# Patient Record
Sex: Female | Born: 2005 | Race: White | Hispanic: No | Marital: Single | Smoking: Former smoker
Health system: Southern US, Community
[De-identification: ages and names within clinical notes are randomized; demographics above are authoritative.]

## PROBLEM LIST (undated history)

## (undated) DIAGNOSIS — R51 Headache: Secondary | ICD-10-CM

## (undated) DIAGNOSIS — B974 Respiratory syncytial virus as the cause of diseases classified elsewhere: Secondary | ICD-10-CM

## (undated) DIAGNOSIS — B338 Other specified viral diseases: Secondary | ICD-10-CM

## (undated) DIAGNOSIS — R519 Headache, unspecified: Secondary | ICD-10-CM

## (undated) HISTORY — PX: MULTIPLE TOOTH EXTRACTIONS: SHX2053

---
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
## 2023-01-03 ENCOUNTER — Ambulatory Visit
Admission: EM | Admit: 2023-01-03 | Discharge: 2023-01-03 | Disposition: A | Discharge status: Home / Self Care | Payer: Medicaid Other

## 2023-01-03 DIAGNOSIS — L02415 Cutaneous abscess of right lower limb: Secondary | ICD-10-CM | POA: Diagnosis not present

## 2023-01-03 MED ORDER — DOXYCYCLINE HYCLATE 100 MG PO CAPS: 100.0000 mg | ORAL_CAPSULE | Freq: Two times a day (BID) | ORAL | 0 refills | Status: AC

## 2023-01-03 NOTE — Discharge Instructions (Signed)
I have prescribed an antibiotic for abscess of your skin.  Use warm compresses as we discussed.  Follow-up if any symptoms persist or worsen.

## 2023-01-03 NOTE — ED Provider Notes (Signed)
[name]    CSN: [id number] Arrival date & time: 01/03/23  1226      History   Chief Complaint Chief Complaint  Patient presents with   Abscess    Abscess inner right thigh x2 days    HPI Brittany Ingram is a 17 y.o. female.   Patient presents with concerns of abscess to right inner thigh that she noticed about 2 to 3 days ago.  Patient denies any purulent drainage from the area or any associated fever.  Denies history of abscesses.   Abscess   Past Medical History:  Diagnosis Date   Headache    RSV infection    age 30 months    Patient Active Problem List   Diagnosis Date Noted   Severe recurrent major depression without psychotic features (HCC) 03/19/2019   MDD (major depressive disorder), recurrent severe, without psychosis (HCC) 05/07/2018    Past Surgical History:  Procedure Laterality Date   CESAREAN SECTION     MULTIPLE TOOTH EXTRACTIONS      OB History   No obstetric history on file.      Home Medications    Prior to Admission medications   Medication Sig Start Date End Date Taking? Authorizing Provider  doxycycline (VIBRAMYCIN) 100 MG capsule Take 1 capsule (100 mg total) by mouth 2 (two) times daily. 01/03/23  Yes , Rolly Salter E, FNP  ibuprofen (ADVIL) 800 MG tablet Take 800 mg by mouth every 8 (eight) hours.   Yes [provider]  Acetaminophen-Caff-Pyrilamine (MIDOL COMPLETE PO) Take 1-2 tablets by mouth every 6 (six) hours as needed (for cramping, pain, or discomfort).     [provider]  Brexpiprazole 1 MG TABS Take 2 tablets (2 mg total) by mouth daily. 03/24/19   Leata Mouse, MD  escitalopram (LEXAPRO) 20 MG tablet Take 1 tablet (20 mg total) by mouth daily. 03/24/19   Leata Mouse, MD  hydrOXYzine (ATARAX/VISTARIL) 50 MG tablet Take 1 tablet (50 mg total) by mouth at bedtime as needed for anxiety (or sleep). 03/23/19   Leata Mouse, MD    Family History Family History   Problem Relation Age of Onset   Mental illness Mother    Alcohol abuse Father    Drug abuse Father     Social History Social History   Tobacco Use   Smoking status: Former    Packs/day: .5    Types: Cigarettes   Smokeless tobacco: Never  Vaping Use   Vaping Use: Every day  Substance Use Topics   Alcohol use: No   Drug use: Yes    Frequency: 3.0 times per week    Types: Marijuana    Comment: Episodic use     Allergies   Depakote [valproic acid]   Review of Systems Review of Systems Per HPI  Physical Exam Triage Vital Signs ED Triage Vitals  Enc Vitals Group     BP 01/03/23 1255 112/79     Pulse Rate 01/03/23 1255 (!) 112     Resp 01/03/23 1255 19     Temp 01/03/23 1255 98.2 F (36.8 C)     Temp src --      SpO2 01/03/23 1255 95 %     Weight 01/03/23 1252 260 lb (117.9 kg)     Height 01/03/23 1252 5\' 6"  (1.676 m)     Head Circumference --      Peak Flow --      Pain Score 01/03/23 1252 5     Pain  Loc --      Pain Edu? --      Excl. in GC? --    No data found.  Updated Vital Signs BP 112/79 (BP Location: Right Arm)   Pulse (!) 112   Temp 98.2 F (36.8 C)   Resp 19   Ht 5\' 6"  (1.676 m)   Wt 260 lb (117.9 kg)   SpO2 95%   BMI 41.97 kg/m   Visual Acuity Right Eye Distance:   Left Eye Distance:   Bilateral Distance:    Right Eye Near:   Left Eye Near:    Bilateral Near:     Physical Exam Constitutional:      General: She is not in acute distress.    Appearance: Normal appearance. She is not toxic-appearing or diaphoretic.  HENT:     Head: Normocephalic and atraumatic.  Eyes:     Extraocular Movements: Extraocular movements intact.     Conjunctiva/sclera: Conjunctivae normal.  Pulmonary:     Effort: Pulmonary effort is normal.  Skin:    Comments: Patient has approximately 0.5 inch very indurated abscess present to right inner thigh.  Patient has surrounding erythema with no induration or fluctuance that extends about 1 inch on each  side.  Minimal warmth noted.  No drainage noted.  Neurological:     General: No focal deficit present.     Mental Status: She is alert and oriented to person, place, and time. Mental status is at baseline.  Psychiatric:        Mood and Affect: Mood normal.        Behavior: Behavior normal.        Thought Content: Thought content normal.        Judgment: Judgment normal.      UC Treatments / Results  Labs (all labs ordered are listed, but only abnormal results are displayed) Labs Reviewed - No data to display  EKG   Radiology No results found.  Procedures Procedures (including critical care time)  Medications Ordered in UC Medications - No data to display  Initial Impression / Assessment and Plan / UC Course  I have reviewed the triage vital signs and the nursing notes.  Pertinent labs & imaging results that were available during my care of the patient were reviewed by me and considered in my medical decision making (see chart for details).     Will treat abscess with doxycycline.  Abscess is not conducive to drainage at this time.  Advised strict follow-up if any symptoms persist or worsen.  Patient verbalized understanding and was agreeable with plan. Final Clinical Impressions(s) / UC Diagnoses   Final diagnoses:  Abscess of right thigh     Discharge Instructions      I have prescribed an antibiotic for abscess of your skin.  Use warm compresses as we discussed.  Follow-up if any symptoms persist or worsen.    ED Prescriptions     Medication Sig Dispense Auth. Provider   doxycycline (VIBRAMYCIN) 100 MG capsule Take 1 capsule (100 mg total) by mouth 2 (two) times daily. 20 capsule Gustavus Bryant, [place]      PDMP not reviewed this encounter.   Gustavus Bryant, [place] 01/03/23 1323

## 2023-01-03 NOTE — ED Triage Notes (Signed)
Pt states that she has an abscess on her inner right thigh. X2 days

## 2023-01-21 ENCOUNTER — Other Ambulatory Visit: Payer: Self-pay

## 2023-01-21 ENCOUNTER — Encounter (HOSPITAL_BASED_OUTPATIENT_CLINIC_OR_DEPARTMENT_OTHER): Payer: Self-pay | Admitting: Emergency Medicine

## 2023-01-21 DIAGNOSIS — Z1152 Encounter for screening for COVID-19: Secondary | ICD-10-CM | POA: Diagnosis not present

## 2023-01-21 DIAGNOSIS — R509 Fever, unspecified: Secondary | ICD-10-CM | POA: Diagnosis present

## 2023-01-21 NOTE — ED Triage Notes (Addendum)
Pt reports fever up to 100.2 at home with chills starting today, temp in triage 98.4, pt reports she had a C-section on 05/30 and reports pain at the incision site, no swelling, drainage, redness or warmth noted

## 2023-01-22 ENCOUNTER — Emergency Department (HOSPITAL_BASED_OUTPATIENT_CLINIC_OR_DEPARTMENT_OTHER)
Admission: EM | Admit: 2023-01-22 | Discharge: 2023-01-22 | Disposition: A | Discharge status: Home / Self Care | Payer: Medicaid Other | Attending: Emergency Medicine | Admitting: Emergency Medicine

## 2023-01-22 DIAGNOSIS — R509 Fever, unspecified: Secondary | ICD-10-CM

## 2023-01-22 LAB — SARS CORONAVIRUS 2 BY RT PCR: SARS Coronavirus 2 by RT PCR: NEGATIVE

## 2023-01-22 NOTE — ED Provider Notes (Signed)
[place] EMERGENCY DEPARTMENT AT [facility] Provider Note   CSN: [id number] Arrival date & time: 01/21/23  2328     History  Chief Complaint  Patient presents with   Fever    Brittany Ingram is a 17 y.o. female.  Patient is a 17 year old female presenting with fever.  This started earlier this afternoon.  She reports temperature of 100.2 and took Tylenol and fever seems to have resolved.  She denies any cough, sore throat, diarrhea, vomiting, or other complaints.  She did have a C-section 5 weeks ago and is concerned about the incision.  The history is provided by the patient.       Home Medications Prior to Admission medications   Medication Sig Start Date End Date Taking? Authorizing Provider  Acetaminophen-Caff-Pyrilamine (MIDOL COMPLETE PO) Take 1-2 tablets by mouth every 6 (six) hours as needed (for cramping, pain, or discomfort).     [provider]  Brexpiprazole 1 MG TABS Take 2 tablets (2 mg total) by mouth daily. 03/24/19   Leata Mouse, MD  doxycycline (VIBRAMYCIN) 100 MG capsule Take 1 capsule (100 mg total) by mouth 2 (two) times daily. 01/03/23   Gustavus Bryant, FNP  escitalopram (LEXAPRO) 20 MG tablet Take 1 tablet (20 mg total) by mouth daily. 03/24/19   Leata Mouse, MD  hydrOXYzine (ATARAX/VISTARIL) 50 MG tablet Take 1 tablet (50 mg total) by mouth at bedtime as needed for anxiety (or sleep). 03/23/19   Leata Mouse, MD  ibuprofen (ADVIL) 800 MG tablet Take 800 mg by mouth every 8 (eight) hours.    [provider]      Allergies    Depakote [valproic acid]    Review of Systems   Review of Systems  All other systems reviewed and are negative.   Physical Exam Updated Vital Signs BP 117/69   Pulse 85   Temp 98.1 F (36.7 C) (Oral)   Resp 16   Ht 5\' 6"  (1.676 m)   Wt 116.6 kg   LMP 12/18/2022 (Exact Date)   SpO2 95%   BMI 41.48 kg/m  Physical Exam Vitals and nursing note reviewed.   Constitutional:      General: She is not in acute distress.    Appearance: She is well-developed. She is not diaphoretic.  HENT:     Head: Normocephalic and atraumatic.  Cardiovascular:     Rate and Rhythm: Normal rate and regular rhythm.     Heart sounds: No murmur heard.    No friction rub. No gallop.  Pulmonary:     Effort: Pulmonary effort is normal. No respiratory distress.     Breath sounds: Normal breath sounds. No wheezing.  Abdominal:     General: Bowel sounds are normal. There is no distension.     Palpations: Abdomen is soft.     Tenderness: There is no abdominal tenderness.     Comments: There is a scar from a prior C-section noted in the lower suprapubic region.  It appears to be healing well.  There is no erythema or wound dehiscence.  There is no palpable fluid collection.  Musculoskeletal:        General: Normal range of motion.     Cervical back: Normal range of motion and neck supple.  Skin:    General: Skin is warm and dry.  Neurological:     General: No focal deficit present.     Mental Status: She is alert and oriented to person, place, and time.  ED Results / Procedures / Treatments   Labs (all labs ordered are listed, but only abnormal results are displayed) Labs Reviewed  SARS CORONAVIRUS 2 BY RT PCR    EKG None  Radiology No results found.  Procedures Procedures    Medications Ordered in ED Medications - No data to display  ED Course/ Medical Decision Making/ A&P  Patient presenting with fever as described in the HPI.  This began earlier today and she is afebrile now with no other symptoms.  She has some concern about her C-section scar, however it looks to be healing perfectly.  At this point, I feel as though patient can safely be discharged with as needed return.  Final Clinical Impression(s) / ED Diagnoses Final diagnoses:  None    Rx / DC Orders ED Discharge Orders     None         Geoffery Lyons, MD 01/22/23  [phone]

## 2023-01-22 NOTE — Discharge Instructions (Signed)
Take Tylenol 1000 mg every 6 hours as needed for fever.  Drink plenty of fluids and get plenty of rest.

## 2023-03-13 ENCOUNTER — Ambulatory Visit
Admission: EM | Admit: 2023-03-13 | Discharge: 2023-03-13 | Disposition: A | Discharge status: Home / Self Care | Payer: Medicaid Other | Attending: Internal Medicine | Admitting: Internal Medicine

## 2023-03-13 ENCOUNTER — Other Ambulatory Visit: Payer: Self-pay

## 2023-03-13 ENCOUNTER — Encounter: Payer: Self-pay | Admitting: Emergency Medicine

## 2023-03-13 DIAGNOSIS — Z20822 Contact with and (suspected) exposure to covid-19: Secondary | ICD-10-CM | POA: Diagnosis present

## 2023-03-13 DIAGNOSIS — J069 Acute upper respiratory infection, unspecified: Secondary | ICD-10-CM | POA: Insufficient documentation

## 2023-03-13 LAB — SARS CORONAVIRUS 2 (TAT 6-24 HRS): SARS Coronavirus 2: POSITIVE — AB

## 2023-03-13 NOTE — ED Triage Notes (Signed)
Pt here for cough and congestion with body aches x 3 days; pt sts was exposed to covid

## 2023-03-13 NOTE — Discharge Instructions (Signed)
Covid test is pending.  We will call if it is positive.  As we discussed, you most likely have COVID-19 given close exposure.  Ensure adequate fluid hydration, rest, and follow-up if any symptoms persist or worsen.

## 2023-03-13 NOTE — ED Provider Notes (Signed)
[name]    CSN: [id number] Arrival date & time: 03/13/23  1001      History   Chief Complaint Chief Complaint  Patient presents with   Cough    HPI Brittany Ingram is a 17 y.o. female.   Patient presents with 3-day history of cough, sore throat, nasal congestion, body aches, fever.  Tmax at home was 101.  Reports that she was exposed to COVID to someone in her household.  She has taken Robitussin and Tylenol for symptoms.  Denies history of asthma and patient denies that she smokes cigarettes.   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      Home Medications    Prior to Admission medications   Medication Sig Start Date End Date Taking? Authorizing Provider  Acetaminophen-Caff-Pyrilamine (MIDOL COMPLETE PO) Take 1-2 tablets by mouth every 6 (six) hours as needed (for cramping, pain, or discomfort).     [provider]  Brexpiprazole 1 MG TABS Take 2 tablets (2 mg total) by mouth daily. 03/24/19   Leata Mouse, MD  doxycycline (VIBRAMYCIN) 100 MG capsule Take 1 capsule (100 mg total) by mouth 2 (two) times daily. Patient not taking: Reported on 03/13/2023 01/03/23   Gustavus Bryant, FNP  escitalopram (LEXAPRO) 20 MG tablet Take 1 tablet (20 mg total) by mouth daily. 03/24/19   Leata Mouse, MD  hydrOXYzine (ATARAX/VISTARIL) 50 MG tablet Take 1 tablet (50 mg total) by mouth at bedtime as needed for anxiety (or sleep). 03/23/19   Leata Mouse, MD  ibuprofen (ADVIL) 800 MG tablet Take 800 mg by  mouth every 8 (eight) hours.    [provider]    Family History Family History  Problem Relation Age of Onset   Mental illness Mother    Alcohol abuse Father    Drug abuse Father     Social History Social History   Tobacco Use   Smoking status: Former    Current packs/day: 0.50    Types: Cigarettes   Smokeless tobacco: Never  Vaping Use   Vaping status: Every Day  Substance Use Topics   Alcohol use: No   Drug use: Not Currently    Frequency: 3.0 times per week    Types: Marijuana    Comment: Episodic use     Allergies   Shellfish allergy and Depakote [valproic acid]   Review of Systems Review of Systems Per HPI  Physical Exam Triage Vital Signs ED Triage Vitals  Encounter Vitals Group     BP 03/13/23 1013 101/63     Systolic BP Percentile --      Diastolic BP Percentile --      Pulse Rate 03/13/23 1013 86     Resp 03/13/23 1013 18     Temp 03/13/23 1013 98.2 F (36.8 C)     Temp Source 03/13/23 1013 Oral     SpO2 03/13/23 1013 96 %     Weight --      Height --  Head Circumference --      Peak Flow --      Pain Score 03/13/23 1014 4     Pain Loc --      Pain Education --      Exclude from Growth Chart --    No data found.  Updated Vital Signs BP 101/63 (BP Location: Left Arm)   Pulse 86   Temp 98.2 F (36.8 C) (Oral)   Resp 18   SpO2 96%   Visual Acuity Right Eye Distance:   Left Eye Distance:   Bilateral Distance:    Right Eye Near:   Left Eye Near:    Bilateral Near:     Physical Exam Constitutional:      General: She is not in acute distress.    Appearance: Normal appearance. She is not toxic-appearing or diaphoretic.  HENT:     Head: Normocephalic and atraumatic.     Right Ear: Tympanic membrane and ear canal normal.     Left Ear: Tympanic membrane and ear canal normal.     Nose: Congestion present.     Mouth/Throat:     Mouth: Mucous membranes are moist.     Pharynx: Posterior oropharyngeal erythema present.   Eyes:     Extraocular Movements: Extraocular movements intact.     Conjunctiva/sclera: Conjunctivae normal.     Pupils: Pupils are equal, round, and reactive to light.  Cardiovascular:     Rate and Rhythm: Normal rate and regular rhythm.     Pulses: Normal pulses.     Heart sounds: Normal heart sounds.  Pulmonary:     Effort: Pulmonary effort is normal. No respiratory distress.     Breath sounds: Normal breath sounds. No stridor. No wheezing, rhonchi or rales.  Abdominal:     General: Abdomen is flat. Bowel sounds are normal.     Palpations: Abdomen is soft.  Musculoskeletal:        General: Normal range of motion.     Cervical back: Normal range of motion.  Skin:    General: Skin is warm and dry.  Neurological:     General: No focal deficit present.     Mental Status: She is alert and oriented to person, place, and time. Mental status is at baseline.  Psychiatric:        Mood and Affect: Mood normal.        Behavior: Behavior normal.      UC Treatments / Results  Labs (all labs ordered are listed, but only abnormal results are displayed) Labs Reviewed  SARS CORONAVIRUS 2 (TAT 6-24 HRS)    EKG   Radiology No results found.  Procedures Procedures (including critical care time)  Medications Ordered in UC Medications - No data to display  Initial Impression / Assessment and Plan / UC Course  I have reviewed the triage vital signs and the nursing notes.  Pertinent labs & imaging results that were available during my care of the patient were reviewed by me and considered in my medical decision making (see chart for details).     Patient presents with symptoms likely from a viral upper respiratory infection.  Do not suspect underlying cardiopulmonary process. Symptoms seem unlikely related to ACS, CHF or COPD exacerbations, pneumonia, pneumothorax. Patient is nontoxic appearing and not in need of emergent medical intervention.  COVID test pending.  Discussed with  patient this is most likely COVID-19 given close exposure.  Recommended symptom control with over the counter medications, supportive care, adequate  fluid hydration and rest.  Return if symptoms fail to improve in 1-2 weeks or you develop shortness of breath, chest pain, severe headache. Patient states understanding and is agreeable.  Discharged with PCP followup.  Final Clinical Impressions(s) / UC Diagnoses   Final diagnoses:  Viral upper respiratory tract infection with cough  Close exposure to COVID-19 virus     Discharge Instructions      Covid test is pending.  We will call if it is positive.  As we discussed, you most likely have COVID-19 given close exposure.  Ensure adequate fluid hydration, rest, and follow-up if any symptoms persist or worsen.    ED Prescriptions   None    PDMP not reviewed this encounter.   Gustavus Bryant, [place] 03/13/23 1057
